# Patient Record
Sex: Female | Born: 2000 | Hispanic: No | Marital: Single | Smoking: Never smoker
Health system: Southern US, Community
[De-identification: ages and names within clinical notes are randomized; demographics above are authoritative.]

## PROBLEM LIST (undated history)

## (undated) DIAGNOSIS — E282 Polycystic ovarian syndrome: Secondary | ICD-10-CM

## (undated) HISTORY — DX: Polycystic ovarian syndrome: E28.2

---
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
## 2018-07-23 ENCOUNTER — Other Ambulatory Visit: Payer: Self-pay | Admitting: Critical Care Medicine

## 2018-07-23 DIAGNOSIS — Z20822 Contact with and (suspected) exposure to covid-19: Secondary | ICD-10-CM

## 2018-07-27 ENCOUNTER — Other Ambulatory Visit: Payer: Self-pay

## 2018-07-27 ENCOUNTER — Observation Stay (HOSPITAL_COMMUNITY)
Admission: AD | Admit: 2018-07-27 | Discharge: 2018-07-28 | Disposition: A | Discharge status: Home / Self Care | Payer: Medicaid Other | Source: Intra-hospital | Attending: Psychiatry | Admitting: Psychiatry

## 2018-07-27 ENCOUNTER — Emergency Department (HOSPITAL_COMMUNITY)
Admission: EM | Admit: 2018-07-27 | Discharge: 2018-07-27 | Disposition: A | Discharge status: Other Acute Inpatient Hospital | Payer: Medicaid Other | Attending: Emergency Medicine | Admitting: Emergency Medicine

## 2018-07-27 ENCOUNTER — Encounter (HOSPITAL_COMMUNITY): Payer: Self-pay | Admitting: *Deleted

## 2018-07-27 DIAGNOSIS — Z03818 Encounter for observation for suspected exposure to other biological agents ruled out: Secondary | ICD-10-CM | POA: Diagnosis not present

## 2018-07-27 DIAGNOSIS — Y999 Unspecified external cause status: Secondary | ICD-10-CM | POA: Insufficient documentation

## 2018-07-27 DIAGNOSIS — Z046 Encounter for general psychiatric examination, requested by authority: Secondary | ICD-10-CM | POA: Diagnosis not present

## 2018-07-27 DIAGNOSIS — E282 Polycystic ovarian syndrome: Secondary | ICD-10-CM | POA: Diagnosis not present

## 2018-07-27 DIAGNOSIS — Z23 Encounter for immunization: Secondary | ICD-10-CM | POA: Insufficient documentation

## 2018-07-27 DIAGNOSIS — S71112A Laceration without foreign body, left thigh, initial encounter: Secondary | ICD-10-CM | POA: Insufficient documentation

## 2018-07-27 DIAGNOSIS — Z915 Personal history of self-harm: Secondary | ICD-10-CM | POA: Diagnosis not present

## 2018-07-27 DIAGNOSIS — Y9389 Activity, other specified: Secondary | ICD-10-CM | POA: Diagnosis not present

## 2018-07-27 DIAGNOSIS — F329 Major depressive disorder, single episode, unspecified: Secondary | ICD-10-CM | POA: Insufficient documentation

## 2018-07-27 DIAGNOSIS — S79922A Unspecified injury of left thigh, initial encounter: Secondary | ICD-10-CM | POA: Diagnosis present

## 2018-07-27 DIAGNOSIS — R45851 Suicidal ideations: Secondary | ICD-10-CM | POA: Diagnosis not present

## 2018-07-27 DIAGNOSIS — Z79899 Other long term (current) drug therapy: Secondary | ICD-10-CM | POA: Insufficient documentation

## 2018-07-27 DIAGNOSIS — Y929 Unspecified place or not applicable: Secondary | ICD-10-CM | POA: Diagnosis not present

## 2018-07-27 DIAGNOSIS — S71111A Laceration without foreign body, right thigh, initial encounter: Secondary | ICD-10-CM | POA: Diagnosis not present

## 2018-07-27 DIAGNOSIS — F322 Major depressive disorder, single episode, severe without psychotic features: Secondary | ICD-10-CM | POA: Diagnosis present

## 2018-07-27 DIAGNOSIS — X788XXA Intentional self-harm by other sharp object, initial encounter: Secondary | ICD-10-CM | POA: Insufficient documentation

## 2018-07-27 LAB — CBC WITH DIFFERENTIAL/PLATELET
Abs Immature Granulocytes: 0.01 10*3/uL (ref 0.00–0.07)
Basophils Absolute: 0 10*3/uL (ref 0.0–0.1)
Basophils Relative: 0 %
Eosinophils Absolute: 0 10*3/uL (ref 0.0–0.5)
Eosinophils Relative: 1 %
HCT: 44.6 % (ref 36.0–46.0)
Hemoglobin: 15.3 g/dL — ABNORMAL HIGH (ref 12.0–15.0)
Immature Granulocytes: 0 %
Lymphocytes Relative: 27 %
Lymphs Abs: 0.9 10*3/uL (ref 0.7–4.0)
MCH: 32.6 pg (ref 26.0–34.0)
MCHC: 34.3 g/dL (ref 30.0–36.0)
MCV: 94.9 fL (ref 80.0–100.0)
Monocytes Absolute: 0.2 10*3/uL (ref 0.1–1.0)
Monocytes Relative: 7 %
Neutro Abs: 2.2 10*3/uL (ref 1.7–7.7)
Neutrophils Relative %: 65 %
Platelets: 214 10*3/uL (ref 150–400)
RBC: 4.7 MIL/uL (ref 3.87–5.11)
RDW: 11.7 % (ref 11.5–15.5)
WBC: 3.4 10*3/uL — ABNORMAL LOW (ref 4.0–10.5)
nRBC: 0 % (ref 0.0–0.2)

## 2018-07-27 LAB — BASIC METABOLIC PANEL
Anion gap: 9 (ref 5–15)
BUN: 6 mg/dL (ref 6–20)
CO2: 21 mmol/L — ABNORMAL LOW (ref 22–32)
Calcium: 9.4 mg/dL (ref 8.9–10.3)
Chloride: 107 mmol/L (ref 98–111)
Creatinine, Ser: 0.6 mg/dL (ref 0.44–1.00)
GFR calc Af Amer: >60 mL/min (ref >60)
GFR calc non Af Amer: >60 mL/min (ref >60)
Glucose, Bld: 116 mg/dL — ABNORMAL HIGH (ref 70–99)
Potassium: 3.4 mmol/L — ABNORMAL LOW (ref 3.5–5.1)
Sodium: 137 mmol/L (ref 135–145)

## 2018-07-27 LAB — RAPID URINE DRUG SCREEN, HOSP PERFORMED
Amphetamines: NOT DETECTED
Barbiturates: NOT DETECTED
Benzodiazepines: NOT DETECTED
Cocaine: NOT DETECTED
Opiates: NOT DETECTED
Tetrahydrocannabinol: NOT DETECTED

## 2018-07-27 LAB — SARS CORONAVIRUS 2 BY RT PCR (HOSPITAL ORDER, PERFORMED IN ~~LOC~~ HOSPITAL LAB): SARS Coronavirus 2: NEGATIVE

## 2018-07-27 LAB — ACETAMINOPHEN LEVEL: Acetaminophen (Tylenol), Serum: <10 ug/mL — ABNORMAL LOW (ref 10–30)

## 2018-07-27 LAB — ETHANOL: Alcohol, Ethyl (B): <10 mg/dL (ref <10)

## 2018-07-27 LAB — I-STAT BETA HCG BLOOD, ED (MC, WL, AP ONLY): I-stat hCG, quantitative: <5 m[IU]/mL (ref <5)

## 2018-07-27 LAB — SALICYLATE LEVEL: Salicylate Lvl: <7 mg/dL (ref 2.8–30.0)

## 2018-07-27 MED ORDER — ACETAMINOPHEN 325 MG PO TABS: 650.0000 mg | ORAL_TABLET | Freq: Four times a day (QID) | ORAL | Status: DC | PRN

## 2018-07-27 MED ORDER — MAGNESIUM HYDROXIDE 400 MG/5ML PO SUSP: 30.0000 mL | Freq: Every day | ORAL | Status: DC | PRN

## 2018-07-27 MED ORDER — LIDOCAINE-EPINEPHRINE (PF) 2 %-1:200000 IJ SOLN
10.0000 mL | Freq: Once | INTRAMUSCULAR | Status: AC
  Administered 2018-07-27: 10 mL
  Filled 2018-07-27: qty 20

## 2018-07-27 MED ORDER — TETANUS-DIPHTH-ACELL PERTUSSIS 5-2.5-18.5 LF-MCG/0.5 IM SUSP
0.5000 mL | Freq: Once | INTRAMUSCULAR | Status: AC
  Administered 2018-07-27: 0.5 mL via INTRAMUSCULAR
  Filled 2018-07-27: qty 0.5

## 2018-07-27 MED ORDER — ALUM & MAG HYDROXIDE-SIMETH 200-200-20 MG/5ML PO SUSP: 30.0000 mL | ORAL | Status: DC | PRN

## 2018-07-27 MED ORDER — HYDROXYZINE HCL 25 MG PO TABS: 25.0000 mg | ORAL_TABLET | Freq: Three times a day (TID) | ORAL | Status: DC | PRN

## 2018-07-27 MED ORDER — TRAZODONE HCL 50 MG PO TABS: 50.0000 mg | ORAL_TABLET | Freq: Every evening | ORAL | Status: DC | PRN

## 2018-07-27 NOTE — BH Assessment (Signed)
Tele Assessment Note   Patient Name: Mary Hobbs MRN: [id number] Referring Physician: Wynetta FinesMessick, Peter C, MD Location of Patient: [facility]  Mary Hobbs is an 18 y.o. female present to [facility] after cutting herself. Patient required three stitches. Patient report she started cutting herself last July 2019 after she got on low estrogen birth control. Since that time patient report she has depressive symptoms such as feeling overwhelmed, anxious, and mood swings around when her period is scheduled to start. Report the symptoms last around one week after her period ends. Report she cut herself to relieve stress. Patient denied suicidal / homicidal ideations, denied auditory / visual hallucinations. Denied history of psychiatric services. Denied psychiatric history in her family. Report she last cut herself four months ago and stressed she tires not to cut herself. Report prior to four months ago she was cutting herself every three weeks.   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 She lives at home with her parents and siblings. Report a happy childhood, denying physical, verbal, or sexual trauma. Denies problems with eating or sleeping. Patient affect was pleasant. She spoke clear and her thought process was within normal range.   Collateral: TTS attempted to contact patient's mother to obtain collateral information and determine if she would be able to keep the patient safe. TTS was unable to reach but left a HIPAA complaint voice-mail.   Disposition:  Reola Calkinsravis Money, NP, recommendation [facility]    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 PCOS (polycystic ovarian syndrome)     Past Surgical History:   Procedure Laterality Date  . WISDOM TOOTH EXTRACTION  2018    Family History: No family history on file.  Social History:  reports that she has never smoked. She has never used smokeless tobacco. She reports that she does not drink alcohol or use drugs.  Additional Social History:  Alcohol / Drug Use Pain Medications: see MAR Prescriptions: see MAR Over the Counter: see MAR History of alcohol / drug use?: No history of alcohol / drug abuse  CIWA: CIWA-Ar BP: 139/89 Pulse Rate: 97 COWS:    Allergies: No Known Allergies  Home Medications: (Not in a hospital admission)   OB/GYN Status:  Patient's last menstrual period was 07/19/2018.  General Assessment Data [facility] T[facility] Assessment: In system Is this a Tele or Face-to-Face Assessment?: Tele Assessment Is this an Initial Assessment or a Re-assessment for this encounter?: Initial Assessment Patient Accompanied by:: N/A Language Other than English: No Living Arrangements: Other (Comment)(live with parents ) What gender do you identify as?: Female Marital status: Single Maiden name: Nettleton  Pregnancy Status: No Living Arrangements: Parent(report lives with parents ) Can pt return to current living arrangement?: Yes Admission Status: Voluntary Is patient capable of signing voluntary admission?: Yes Referral Source: Self/Family/Friend Insurance type: Medicaid      Crisis Care Plan Living Arrangements: Parent(report lives with parents ) Legal Guardian: Other:(self) Name of Psychiatrist: denies  Name of Therapist: denies   Education Status Is patient currently in school?: Yes  Current Grade: Freshman, [employer]  Highest grade of school patient has completed: completed high school, [employer]  Name of school: [employer]  Contact person: n/a  Risk to self with the past 6 months Suicidal Ideation: No Has patient been a risk to self within the past 6 months prior to admission? : No Suicidal  Intent: No Has patient had any suicidal intent within the past 6 months prior to admission? : No Is patient at risk for suicide?: No Suicidal Plan?: No Has patient had any suicidal plan within the past 6 months prior to admission? : No Access to Means: No What has been your use of drugs/alcohol within the last 12 months?: none report  Previous Attempts/Gestures: No How many times?: 0 Other Self Harm Risks: patient cuts herself Triggers for Past Attempts: Other (Comment)(unspecified depression after montly cycle) Intentional Self Injurious Behavior: Cutting Comment - Self Injurious Behavior: pt report she cuts after her cycle  Family Suicide History: No Recent stressful life event(s): (denied ) Persecutory voices/beliefs?: No Depression: Yes Depression Symptoms: (feeling overwhelmed ) Substance abuse history and/or treatment for substance abuse?: No Suicide prevention information given to non-admitted patients: Not applicable  Risk to Others within the past 6 months Homicidal Ideation: No Does patient have any lifetime risk of violence toward others beyond the six months prior to admission? : No Thoughts of Harm to Others: No Current Homicidal Intent: No Current Homicidal Plan: No Access to Homicidal Means: No Identified Victim: na History of harm to others?: No Assessment of Violence: None Noted Violent Behavior Description: none noted  Does patient have access to weapons?: No Criminal Charges Pending?: No Does patient have a court date: No Is patient on probation?: No  Psychosis Hallucinations: None noted Delusions: None noted  Mental Status Report Appearance/Hygiene: In hospital gown Eye Contact: Good Motor Activity: Freedom of movement Speech: Logical/coherent Level of Consciousness: Alert Mood: Pleasant Affect: Appropriate to circumstance Anxiety Level: None Thought Processes: Coherent, Relevant Judgement: Unimpaired Orientation: Person, Place, Time, Situation,  Appropriate for developmental age Obsessive Compulsive Thoughts/Behaviors: None  Cognitive Functioning Concentration: Normal Memory: Recent Intact, Remote Intact Is patient IDD: No Insight: Fair Impulse Control: Fair Appetite: Good Have you had any weight changes? : No Change Sleep: No Change Total Hours of Sleep: 8(report no issues with sleep ) Vegetative Symptoms: None  ADLScreening [facility]) Patient's cognitive ability adequate to safely complete daily activities?: Yes Patient able to express need for assistance with ADLs?: Yes Independently performs ADLs?: Yes (appropriate for developmental age)  Prior Inpatient Therapy Prior Inpatient Therapy: No  Prior Outpatient Therapy Prior Outpatient Therapy: No Does patient have an ACCT team?: No Does patient have Intensive In-House Services?  : No Does patient have [facility] services? : No Does patient have P4CC services?: No  ADL Screening (condition at time of admission) Patient's cognitive ability adequate to safely complete daily activities?: Yes Is the patient deaf or have difficulty hearing?: No Does the patient have difficulty seeing, even when wearing glasses/contacts?: No Does the patient have difficulty concentrating, remembering, or making decisions?: No Patient able to express need for assistance with ADLs?: Yes Does the patient have difficulty dressing or bathing?: No Independently performs ADLs?: Yes (appropriate for developmental age) Does the patient have difficulty walking or climbing stairs?: No Weakness of Legs: None Weakness of Arms/Hands: None       Abuse/Neglect Assessment (Assessment to be complete while patient is alone) Abuse/Neglect Assessment Can Be Completed: Yes Physical Abuse: Denies Verbal Abuse: Denies Sexual  Abuse: Denies Exploitation of patient/patient's resources: Denies     Regulatory affairs officer (For Healthcare) Does Patient Have a Medical Advance Directive?: No Would  patient like information on creating a medical advance directive?: No - Patient declined          Disposition:  Disposition Initial Assessment Completed for this Encounter: Yes  This service was provided via telemedicine using a 2-way, interactive audio and video technology.  Names of all persons participating in this telemedicine service and their role in this encounter. Name: Mary Hobbs Role: patient   Name: Mary Hobbs.  Role: TTS   Mary Hobbs [facility] 07/27/2018 5:38 PM

## 2018-07-27 NOTE — Discharge Instructions (Signed)
Go directly to [facility].

## 2018-07-27 NOTE — ED Notes (Signed)
Pt changed into purple scrubs. Wanded and cleared by security.

## 2018-07-27 NOTE — ED Notes (Signed)
Observation and transfer consent forms signed by Pt. [facility] transport contacted and on the way.

## 2018-07-27 NOTE — ED Notes (Signed)
Two open lacerations noted to anterior side of left thigh. One 5 cm in length and another at 8 cm. One laceration noted to right thigh measuring at 5 cm. Bleeding controlled. Adipose tissue visible.

## 2018-07-27 NOTE — Plan of Care (Signed)
[facility] Observation Crisis Plan  Reason for Crisis Plan:  Crisis Stabilization and Medication Management   Plan of Care:  Referral for Inpatient Hospitalization and Referral for IOP  Family Support:      Current Living Environment:  Living Arrangements: Parent  Insurance:   Hospital Account    Name Acct ID Class Status Primary Coverage   Zionah, [place] [id number] [place]        Guarantor Account (for Hospital Account [id number])    Name Relation to Pt Service Area Active? Acct Type   Madill, Maureen Chatters Self [facility] Yes [facility]   Address Phone       [address] [place], [place] [address] [phone])          Coverage Information (for Hospital Account [id number])    F/O Payor/Plan Precert #   [place] Subscriber #   Cassidy, Tashiro [id number]   Address Phone   [address] [place], [place] [address] [phone]      Legal Guardian:     Primary Care Provider:  [facility]  Current Outpatient Providers:N/A  Psychiatrist:     Counselor/Therapist:     Compliant with Medications:N/A  Additional Information:   Clarisa Fling 7/12/202010:06 PM

## 2018-07-27 NOTE — ED Triage Notes (Signed)
Pt. Stated, This all started last August when I started college.

## 2018-07-27 NOTE — ED Triage Notes (Signed)
Pt. Stated, I just trying to hurt myself, I cut my left thigh area.  Pt. Crying at SORT.

## 2018-07-27 NOTE — ED Provider Notes (Signed)
[facility] EMERGENCY DEPARTMENT Provider Note   CSN: [id number] Arrival date & time: 07/27/18  1425     History   Chief Complaint Chief Complaint  Patient presents with  . Extremity Laceration  . Suicidal    HPI Mary Hobbs is a 18 y.o. female.     18 year old female with medical history as detailed below presents for evaluation of self-inflicted lacerations.  Patient reports that she was distraught and upset.  She cut herself in order to make herself feel better.  She denies current suicidality.  Patient admits to prior cutting behaviors.  She reports acute lacerations to the anterior aspects of both thighs.  She reports to use a small razor for this.  She is unsure of her last tetanus.  She denies overdose.  She denies prior psychiatric care or treatment.  The history is provided by the patient and medical records.  Laceration Location: anterior left and right thighs. Depth:  Cutaneous Quality: straight   Bleeding: controlled   Time since incident:  2 hours Laceration mechanism:  Razor Pain details:    Severity:  Mild   Progression:  Unchanged Foreign body present:  No foreign bodies Relieved by:  Nothing Worsened by:  Nothing Tetanus status:  Unknown   Past Medical History:  Diagnosis Date  . PCOS (polycystic ovarian syndrome)     There are no active problems to display for this patient.   Past Surgical History:  Procedure Laterality Date  . WISDOM TOOTH EXTRACTION  2018     OB History   No obstetric history on file.      Home Medications    Prior to Admission medications   Medication Sig Start Date End Date Taking? Authorizing Provider  LO LOESTRIN FE 1 MG-10 MCG / 10 MCG tablet Take 1 tablet by mouth daily. 07/23/17   Constant, Peggy, MD    Family History No family history on file.  Social History Social History   Tobacco Use  . Smoking status: Never Smoker  . Smokeless tobacco: Never Used  Substance Use  Topics  . Alcohol use: No  . Drug use: Never     Allergies   Patient has no known allergies.   Review of Systems Review of Systems   Physical Exam Updated Vital Signs BP 139/89 (BP Location: Right Arm)   Pulse 97   Temp 98.3 F (36.8 C) (Oral)   Resp 12   Ht 5' (1.524 m)   Wt 57.2 kg   LMP 07/19/2018   SpO2 99%   BMI 24.61 kg/m   Physical Exam Vitals signs and nursing note reviewed.  Constitutional:      General: She is not in acute distress.    Appearance: She is well-developed.  HENT:     Head: Normocephalic and atraumatic.  Eyes:     Conjunctiva/sclera: Conjunctivae normal.     Pupils: Pupils are equal, round, and reactive to light.  Neck:     Musculoskeletal: Normal range of motion and neck supple.  Cardiovascular:     Rate and Rhythm: Normal rate and regular rhythm.     Heart sounds: Normal heart sounds.  Pulmonary:     Effort: Pulmonary effort is normal. No respiratory distress.     Breath sounds: Normal breath sounds.  Abdominal:     General: There is no distension.     Palpations: Abdomen is soft.     Tenderness: There is no abdominal tenderness.  Musculoskeletal: Normal range of motion.  General: No deformity.  Skin:    General: Skin is warm and dry.     Comments: Lacerations noted to the anterior right and left thighs Laceration on the right anterior thigh is approximately 3 cm in length  2 lacerations are noted to the anterior left thigh -1 is 2 cm in length and the other is approximately 3 cm in length  No active bleeding noted.  Wounds are superficial with minimal involvement of underlying structure   Neurological:     Mental Status: She is alert and oriented to person, place, and time.      ED Treatments / Results  Labs (all labs ordered are listed, but only abnormal results are displayed) Labs Reviewed  BASIC METABOLIC PANEL - Abnormal; Notable for the following components:      Result Value   Potassium 3.4 (*)    CO2 21  (*)    Glucose, Bld 116 (*)    All other components within normal limits  ACETAMINOPHEN LEVEL - Abnormal; Notable for the following components:   Acetaminophen (Tylenol), Serum <10 (*)    All other components within normal limits  CBC WITH DIFFERENTIAL/PLATELET - Abnormal; Notable for the following components:   WBC 3.4 (*)    Hemoglobin 15.3 (*)    All other components within normal limits  ETHANOL  SALICYLATE LEVEL  RAPID URINE DRUG SCREEN, HOSP PERFORMED  I-STAT BETA HCG BLOOD, ED ([facility], [facility], [facility] ONLY)    EKG None  Radiology No results found.  Procedures .Marland Kitchen.Laceration Repair  Date/Time: 07/27/2018 4:34 PM Performed by: Wynetta FinesMessick, Valoria Tamburri C, MD Authorized by: Wynetta FinesMessick, Raynee Mccasland C, MD   Consent:    Consent obtained:  Verbal   Consent given by:  Patient   Risks discussed:  Infection, pain, poor cosmetic result, need for additional repair, nerve damage, poor wound healing, retained foreign body, tendon damage and vascular damage   Alternatives discussed:  No treatment Anesthesia (see MAR for exact dosages):    Anesthesia method:  Local infiltration   Local anesthetic:  Lidocaine 2% WITH epi Laceration details:    Location: Anterior right and left thighs.   Wound length (cm): 3 lacerations -3 cm laceration to the anterior right thigh 2 cm laceration to the anterior left thigh 3 cm laceration anterior left thigh,   Laceration depth: 5. Repair type:    Repair type:  Simple Pre-procedure details:    Preparation:  Patient was prepped and draped in usual sterile fashion Exploration:    Wound extent: no foreign bodies/material noted, no nerve damage noted, no tendon damage noted, no underlying fracture noted and no vascular damage noted   Treatment:    Area cleansed with:  Betadine and saline   Amount of cleaning:  Extensive   Irrigation solution:  Sterile saline Skin repair:    Repair method:  Sutures   Suture size:  4-0   Suture material:  Prolene   Suture technique:  Running  Approximation:    Approximation:  Close Post-procedure details:    Dressing:  Open (no dressing)   Patient tolerance of procedure:  Tolerated well, no immediate complications   (including critical care time)  Medications Ordered in ED Medications  Tdap (BOOSTRIX) injection 0.5 mL (has no administration in time range)  lidocaine-EPINEPHrine (XYLOCAINE W/EPI) 2 %-1:200000 (PF) injection 10 mL (has no administration in time range)     Initial Impression / Assessment and Plan / ED Course  I have reviewed the triage vital signs and the nursing notes.  Pertinent labs & imaging results that were available during my care of the patient were reviewed by me and considered in my medical decision making (see chart for details).       MDM  Screen complete  Thana AtesLesdi Y Blassingame was evaluated in Emergency Department on 07/27/2018 for the symptoms described in the history of present illness. She was evaluated in the context of the global COVID-19 pandemic, which necessitated consideration that the patient might be at risk for infection with the SARS-CoV-2 virus that causes COVID-19. Institutional protocols and algorithms that pertain to the evaluation of patients at risk for COVID-19 are in a state of rapid change based on information released by regulatory bodies including the CDC and federal and state organizations. These policies and algorithms were followed during the patient's care in the ED.  Patient is presenting for evaluation of self-inflicted lacerations to the anterior thighs.  She is expressing symptoms of depression with possible SI.  Lacerations repaired in the ED without difficulty.  Patient tolerated this well.  Screening labs obtained.  Patient is without evidence of significant other acute medical process.  She is medically clear at this time for further psychiatric evaluation and treatment.    Final disposition is dependent upon psychiatric assessment.  Final Clinical  Impressions(s) / ED Diagnoses   Final diagnoses:  Suicidal ideation  Laceration of left thigh, initial encounter  Laceration of right thigh, initial encounter    ED Discharge Orders    None       Wynetta FinesMessick, Crissy Mccreadie C, MD 07/27/18 1755

## 2018-07-27 NOTE — [facility] Counselor (Signed)
TTS writer attempted to contact patient's mother to obtain collateral information. There was no answer, HIPAA complaint voice-mail left.

## 2018-07-27 NOTE — ED Notes (Signed)
Pt transported to [facility] by [facility]. Discharge paperwork completed by MD. Transfer and observation consent forms signed.   Patient verbalizes understanding of discharge instructions.

## 2018-07-28 ENCOUNTER — Encounter (HOSPITAL_COMMUNITY): Payer: Self-pay | Admitting: Registered Nurse

## 2018-07-28 DIAGNOSIS — F329 Major depressive disorder, single episode, unspecified: Secondary | ICD-10-CM | POA: Diagnosis not present

## 2018-07-28 DIAGNOSIS — F322 Major depressive disorder, single episode, severe without psychotic features: Secondary | ICD-10-CM | POA: Diagnosis not present

## 2018-07-28 LAB — NOVEL CORONAVIRUS, NAA: SARS-CoV-2, NAA: NOT DETECTED

## 2018-07-28 NOTE — Progress Notes (Signed)
D: Patient States, "I feel much better, I hadn't slept in like 3 days because I was studying for chemistry and I think that was part of my problem, I was so overwhelmed."  Pt verbalizes readiness for discharge. Denies suicidal and homicidal ideations. Denies auditory and visual hallucinations.  No complaints of pain.  A:  Patient receptive to discharge instructions. Questions encouraged, both verbalize understanding.  R:  Escorted to the lobby by this RN.

## 2018-07-28 NOTE — Discharge Instructions (Signed)
For your behavioral health needs, you are advised to follow up with an outpatient therapist.  Call one of the following providers at your earliest opportunity to ask about scheduling an intake appointment:       [facility] [place]      [address]      [place], [place] [address]      [phone]       [facility] [place] [place], [place] [address]      [phone]       [facility]      [address]      [place],  [address]      [phone]

## 2018-07-28 NOTE — BH Assessment (Signed)
[facility] Assessment Progress Note  Per Hampton Abbot, MD, this pt does not require psychiatric hospitalization at this time.  Pt is to be discharged from the [facility] Observ[facility] Unit with referral information for area outpatient therapists.  This has been included in pt's discharge instructions.  Pt's nurse, Freda Munro, has been notified.  Jalene Mullet, [place] Triage Specialist [phone]

## 2018-07-28 NOTE — Discharge Summary (Addendum)
[id number]  Psych ED Discharge  07/28/2018 12:45 PM [name]  MRN:  [id number] Principal Problem: MDD (major depressive disorder), severe [facility](HCC) Discharge Diagnoses: Principal Problem:   MDD (major depressive disorder), severe (HCC)   Evaluation: Mary Hobbs was seen by attending psychiatrist and nurse practitioner. Mary Hobbs is awake, alert and oriented X 3.  Patient is currently denying suicidal or homicidal ideations during this assessment.  Denies auditory or  visual hallucinations.  Patient reports feeling stressed and overwhelmed related to his starting school and caring for her younger siblings.  She denies depression or depressive symptoms.  Reports a history of cutting to relieve anxiety and stress.  Denies previous inpatient admissions.  Denies previous suicide attempts.  Collateral obtained.  NP spoke to patient's mother who felt safe with patient coming home.  Discussed starting partial hospitalization programming on 07/29/2018 patient and family was receptive to this plan. Support, encouragement and reassurances was provided.  Per admission assessment-  Mary Hobbs is an 18 y.o. female present to [facility] after cutting herself. Patient required three stitches. Patient report she started cutting herself last July 2019 after she got on low estrogen birth control. Since that time patient report she has depressive symptoms such as feeling overwhelmed, anxious, and mood swings around when her period is scheduled to start. Report the symptoms last around one week after her period ends. Report she cut herself to relieve stress. Patient denied suicidal / homicidal ideations, denied auditory / visual hallucinations. Denied history of psychiatric services. Denied psychiatric history in her family. Report she last cut herself four months ago and stressed she tires not to cut herself. Report prior to four months ago she was cutting herself every three weeks.   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 She lives at home with her parents and siblings. Report a happy childhood, denying physical, verbal, or sexual trauma. Denies problems with eating or sleeping. Patient affect was pleasant. She spoke clear and her thought process was within normal range  Total Time spent with patient: 15 minutes  Past Psychiatric History:   Past Medical History:  Past Medical History:  Diagnosis Date  . PCOS (polycystic ovarian syndrome)     Past Surgical History:  Procedure Laterality Date  . WISDOM TOOTH EXTRACTION  2018   Family History: History reviewed. No pertinent family history. Family Psychiatric  History: Social History:  Social History   Substance and Sexual Activity  Alcohol Use No     Social History   Substance and Sexual Activity  Drug Use Never    Social History   Socioeconomic History  . Marital status: Single    Spouse name: Not on file  . Number of children: Not on file  . Years of education: Not on file  . Highest education level: Not on file  Occupational History  . Not on file  Social Needs  . Financial resource strain: Not on file  . Food insecurity    Worry: Not on file    Inability: Not on file  . Transportation needs    Medical: Not on file    Non-medical: Not on file  Tobacco Use  . Smoking status: Never Smoker  . Smokeless tobacco: Never Used  Substance and Sexual Activity  . Alcohol use: No  . Drug use: Never  . Sexual activity: Yes    Partners: Male  Lifestyle  . Physical activity    Days per week: Not on file    Minutes per  session: Not on file  . Stress: Not on file  Relationships  . Social Musicianconnections    Talks on phone: Not on file    Gets together: Not on file    Attends religious service: Not on file    Active member of club or organization: Not on file    Attends meetings of clubs or organizations: Not on file    Relationship status: Not on file  Other Topics Concern  . Not on file  Social History  Narrative  . Not on file    Has this patient used any form of tobacco in the last 30 days? (Cigarettes, Smokeless Tobacco, Cigars, and/or Pipes)   Current Medications: Current Facility-Administered Medications  Medication Dose Route Frequency Provider Last Rate Last Dose  . acetaminophen (TYLENOL) tablet 650 mg  650 mg Oral Q6H PRN Money, Gerlene Burdockravis B, FNP      . alum & mag hydroxide-simeth (MAALOX/MYLANTA) 200-200-20 MG/5ML suspension 30 mL  30 mL Oral Q4H PRN Money, Gerlene Burdockravis B, FNP      . hydrOXYzine (ATARAX/VISTARIL) tablet 25 mg  25 mg Oral TID PRN Money, Gerlene Burdockravis B, FNP      . magnesium hydroxide (MILK OF MAGNESIA) suspension 30 mL  30 mL Oral Daily PRN Money, Gerlene Burdockravis B, FNP      . traZODone (DESYREL) tablet 50 mg  50 mg Oral QHS PRN Money, Gerlene Burdockravis B, FNP       PTA Medications: Medications Prior to Admission  Medication Sig Dispense Refill Last Dose  . LO LOESTRIN FE 1 MG-10 MCG / 10 MCG tablet Take 1 tablet by mouth daily. 3 Package 3     Musculoskeletal: Strength & Muscle Tone: within normal limits Gait & Station: normal Patient leans: N/A  Psychiatric Specialty Exam: Physical Exam  Vitals reviewed. Constitutional: She appears well-developed.  Psychiatric: She has a normal mood and affect. Her behavior is normal.    Review of Systems  Psychiatric/Behavioral: Negative for depression and suicidal ideas. The patient is nervous/anxious.   All other systems reviewed and are negative.   Blood pressure 130/73, pulse 93, temperature 99 F (37.2 C), resp. rate 16, last menstrual period 07/19/2018, SpO2 100 %.There is no height or weight on file to calculate BMI.  General Appearance: Casual  Eye Contact:  Good  Speech:  Clear and Coherent  Volume:  Normal  Mood:  Anxious and Depressed  Affect:  Congruent  Thought Process:  Coherent  Orientation:  Full (Time, Place, and Person)  Thought Content:  Logical  Suicidal Thoughts:  No  Homicidal Thoughts:  No  Memory:  Immediate;    Fair Recent;   Fair  Judgement:  Fair  Insight:  Fair  Psychomotor Activity:  Normal  Concentration:  Concentration: Fair  Recall:  FiservFair  Fund of Knowledge:  Fair  Language:  Fair  Akathisia:  No  Handed:  Right  AIMS (if indicated):     Assets:  Communication Skills Desire for Improvement Physical Health Resilience Social Support  ADL's:  Intact  Cognition:  WNL  Sleep:        Demographic Factors:  NA  Loss Factors: NA  Historical Factors: NA  Risk Reduction Factors:   Responsible for children under 20 years of age, Sense of responsibility to family, Living with another person, especially a relative, Positive social support and Positive therapeutic relationship  Continued Clinical Symptoms:  Depression/ Anxiety related to stress  Cognitive Features That Contribute To Risk:  Closed-mindedness    Suicide Risk:  Minimal: No identifiable suicidal ideation.  Patients presenting with no risk factors but with morbid ruminations; may be classified as minimal risk based on the severity of the depressive symptoms    Plan Of Care/Follow-up recommendations:  Activity:  as tolerated Diet:  heart healthy   Disposition:    Discharge from [facility] Unit Assessment for Partial Hospitalization on 07/29/2018  Take all medications as prescribed. Keep all follow-up appointments as scheduled.  Do not consume alcohol or use illegal drugs while on prescription medications. Report any adverse effects from your medications to your primary care provider promptly.  In the event of recurrent symptoms or worsening symptoms, call 911, a crisis hotline, or go to the nearest emergency department for evaluation.    Derrill Center, NP 07/28/2018, 12:45 PM   Patient seen, evaluated by me and treatment plan done by me. Patient safe for discharge with follow up in [facility]

## 2018-07-28 NOTE — Progress Notes (Signed)
[facility] NOVEL CORONAVIRUS (COVID-19) DAILY CHECK-OFF SYMPTOMS - answer yes or no to each - every day NO YES  Have you had a fever in the past 24 hours?  . Fever (Temp > 37.80C / 100F) X   Have you had any of these symptoms in the past 24 hours? . New Cough .  Sore Throat  .  Shortness of Breath .  Difficulty Breathing .  Unexplained Body Aches   X   Have you had any one of these symptoms in the past 24 hours not related to allergies?   . Runny Nose .  Nasal Congestion .  Sneezing   X   If you have had runny nose, nasal congestion, sneezing in the past 24 hours, has it worsened?  X   EXPOSURES - check yes or no X   Have you traveled outside the state in the past 14 days?  X   Have you been in contact with someone with a confirmed diagnosis of COVID-19 or PUI in the past 14 days without wearing appropriate PPE?  X   Have you been living in the same home as a person with confirmed diagnosis of COVID-19 or a PUI (household contact)?    X   Have you been diagnosed with COVID-19?    X              What to do next: Answered NO to all: Answered YES to anything:   Proceed with unit schedule Follow the [facility] Inpatient Flowsheet.   

## 2018-07-29 ENCOUNTER — Telehealth (HOSPITAL_COMMUNITY): Payer: Self-pay | Admitting: Professional

## 2018-08-05 ENCOUNTER — Emergency Department (HOSPITAL_COMMUNITY)
Admission: EM | Admit: 2018-08-05 | Discharge: 2018-08-05 | Disposition: A | Discharge status: Home / Self Care | Payer: Medicaid Other | Attending: Emergency Medicine | Admitting: Emergency Medicine

## 2018-08-05 ENCOUNTER — Encounter (HOSPITAL_COMMUNITY): Payer: Self-pay | Admitting: *Deleted

## 2018-08-05 ENCOUNTER — Other Ambulatory Visit: Payer: Self-pay

## 2018-08-05 DIAGNOSIS — Z4802 Encounter for removal of sutures: Secondary | ICD-10-CM | POA: Diagnosis present

## 2018-08-05 NOTE — ED Triage Notes (Signed)
Pt comes today for removal of sutures. Incisions healing ,no drainage observed. There is bruseing at incision site. RT and Lt upper anterior thigh.

## 2018-08-05 NOTE — ED Provider Notes (Signed)
[facility] EMERGENCY DEPARTMENT Provider Note   CSN: [id number] Arrival date & time: 08/05/18  1131    History   Chief Complaint Chief Complaint  Patient presents with  . Suture / Staple Removal    HPI Mary Hobbs is a 18 y.o. female.     HPI   18 year old female presents today for suture removal.  She suffered self-inflicted lacerations to her bilateral thighs on 07/27/2018.  She had stitches placed at that time.  She notes no complications.  She notes that she is feeling much better mentally with no complaints.  Past Medical History:  Diagnosis Date  . PCOS (polycystic ovarian syndrome)     Patient Active Problem List   Diagnosis Date Noted  . MDD (major depressive disorder), single episode, severe , no psychosis ([place]) 07/28/2018  . MDD (major depressive disorder), severe ([place]) 07/27/2018    Past Surgical History:  Procedure Laterality Date  . WISDOM TOOTH EXTRACTION  2018     OB History   No obstetric history on file.      Home Medications    Prior to Admission medications   Medication Sig Start Date End Date Taking? Authorizing Provider  LO LOESTRIN FE 1 MG-10 MCG / 10 MCG tablet Take 1 tablet by mouth daily. 07/23/17   Constant, Vickii Chafe, MD    Family History History reviewed. No pertinent family history.  Social History Social History   Tobacco Use  . Smoking status: Never Smoker  . Smokeless tobacco: Never Used  Substance Use Topics  . Alcohol use: No  . Drug use: Never     Allergies   Patient has no known allergies.   Review of Systems Review of Systems  All other systems reviewed and are negative.    Physical Exam Updated Vital Signs BP 116/68 (BP Location: Right Arm)   Pulse 81   Temp 98.3 F (36.8 C) (Oral)   Resp 16   LMP 07/19/2018   SpO2 98%   Physical Exam Vitals signs and nursing note reviewed.  Constitutional:      Appearance: She is well-developed.  HENT:     Head: Normocephalic and  atraumatic.  Eyes:     General: No scleral icterus.       Right eye: No discharge.        Left eye: No discharge.     Conjunctiva/sclera: Conjunctivae normal.     Pupils: Pupils are equal, round, and reactive to light.  Neck:     Musculoskeletal: Normal range of motion.     Vascular: No JVD.     Trachea: No tracheal deviation.  Pulmonary:     Effort: Pulmonary effort is normal.     Breath sounds: No stridor.  Skin:    Comments: 1 laceration on the right upper thigh approximately 4 cm in length, 2 lacerations on the left upper thigh approximately 4, and 5 cm in length-stitches in place with no surrounding redness discharge or swelling  Neurological:     Mental Status: She is alert and oriented to person, place, and time.     Coordination: Coordination normal.  Psychiatric:        Behavior: Behavior normal.        Thought Content: Thought content normal.        Judgment: Judgment normal.      ED Treatments / Results  Labs (all labs ordered are listed, but only abnormal results are displayed) Labs Reviewed - No data to display  EKG  None  Radiology No results found.  Procedures .Suture Removal  Date/Time: 08/05/2018 12:09 PM Performed by: Eyvonne MechanicHedges, Wendal Wilkie, PA-C Authorized by: Eyvonne MechanicHedges, Ashritha Desrosiers, PA-C   Consent:    Consent obtained:  Verbal   Consent given by:  Patient   Risks discussed:  Wound separation, pain and bleeding   Alternatives discussed:  No treatment, alternative treatment and delayed treatment Location:    Location:  Lower extremity   Lower extremity location: bilateral thighs  Procedure details:    Wound appearance:  No signs of infection, good wound healing and clean   Number of sutures removed:  3 Post-procedure details:    Post-removal:  Antibiotic ointment applied   Patient tolerance of procedure:  Tolerated well, no immediate complications   (including critical care time)  Medications Ordered in ED Medications - No data to display   Initial  Impression / Assessment and Plan / ED Course  I have reviewed the triage vital signs and the nursing notes.  Pertinent labs & imaging results that were available during my care of the patient were reviewed by me and considered in my medical decision making (see chart for details).        Patient here for suture removal.  No complicating features.  No signs of emotional distress.  Discharged with return precautions.  Patient verbalized understanding and agreement to today's plan had no further questions or concerns at time of discharge. Final Clinical Impressions(s) / ED Diagnoses   Final diagnoses:  Visit for suture removal    ED Discharge Orders    None       Rosalio LoudHedges, Damani Kelemen, PA-C 08/05/18 1211    Loren RacerYelverton, David, MD 08/08/18 1659

## 2018-08-05 NOTE — Discharge Instructions (Addendum)
Please read attached information. If you experience any new or worsening signs or symptoms please return to the emergency room for evaluation. Please follow-up with your primary care provider or specialist as discussed.  °

## 2018-08-05 NOTE — ED Notes (Signed)
Patient verbalizes understanding of discharge instructions . Opportunity for questions and answers were provided . Armband removed by staff ,Pt discharged from ED. W/C  offered at D/C  and Declined W/C at D/C and was escorted to lobby by RN.  

## 2018-10-05 IMAGING — US US PELVIS COMPLETE TRANSABD/TRANSVAG
1 series · 14 of 25 positions shown · non-contrast
Comparison: None

CLINICAL DATA: Initial evaluation for amenorrhea.  History of PCOS.



[Series 1: us pelvis complete transabd/transvag · 0.17mm/px · 14 of 71 slices shown]
[im 1/71]
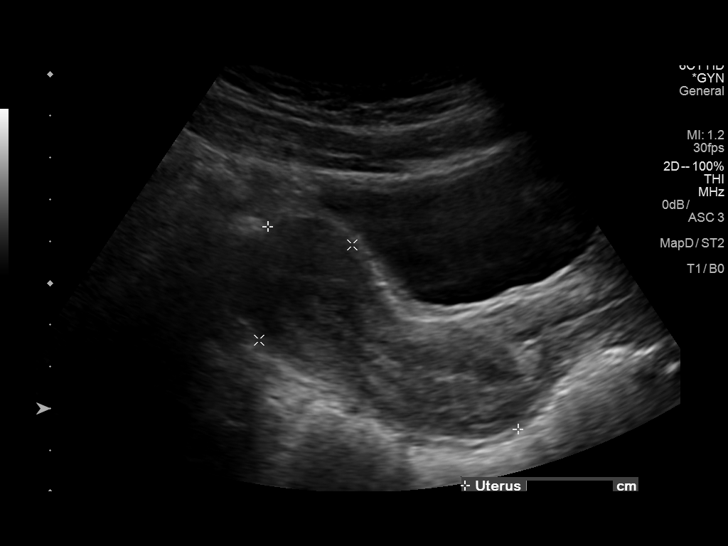
[im 6/71]
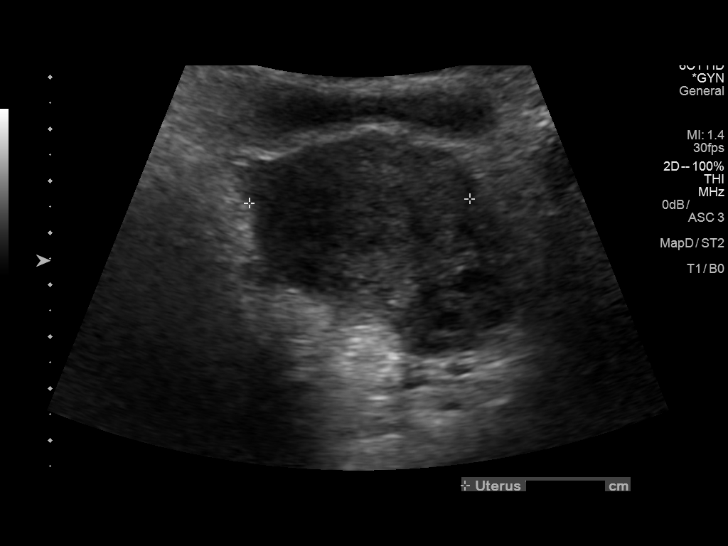
[im 12/71]
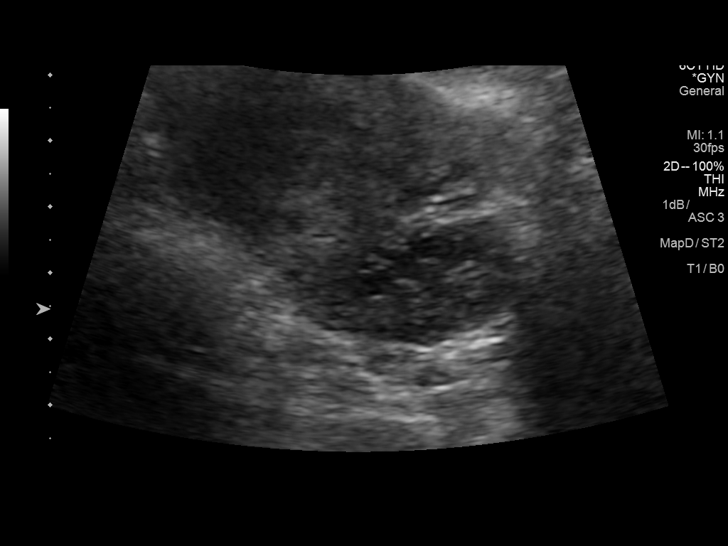
[im 18/71]
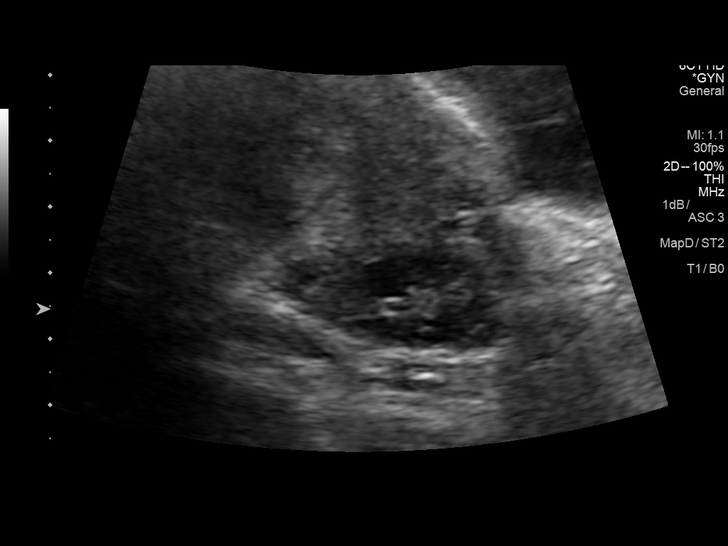
[im 24/71]
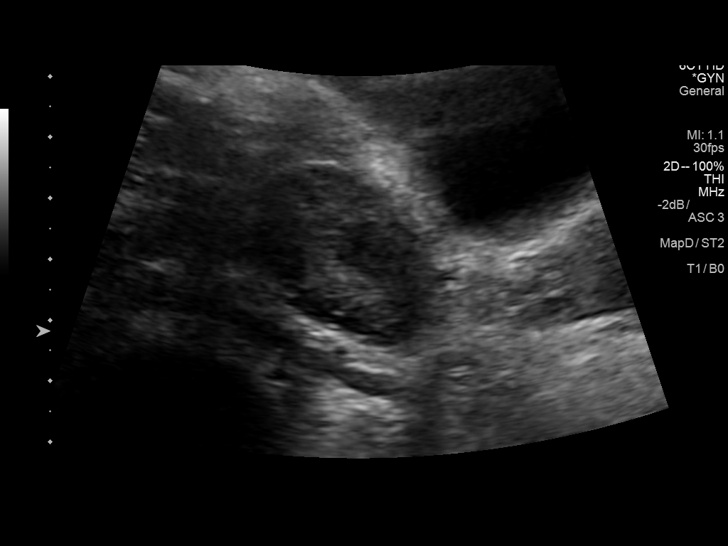
[im 27/71]
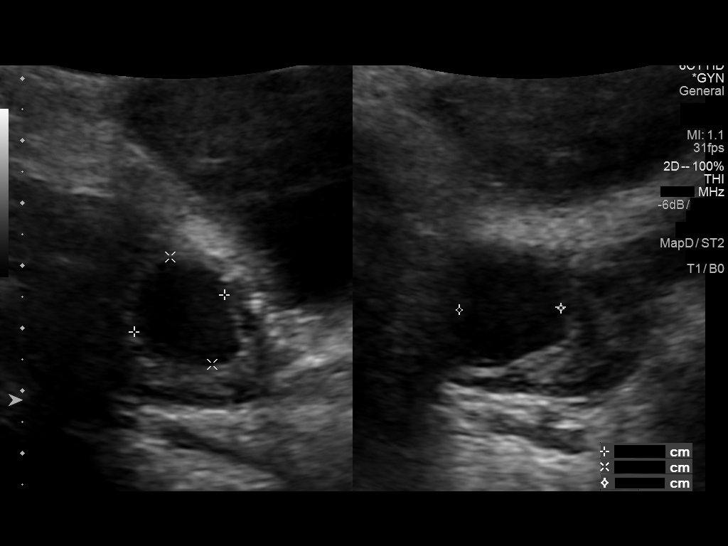
[im 33/71]
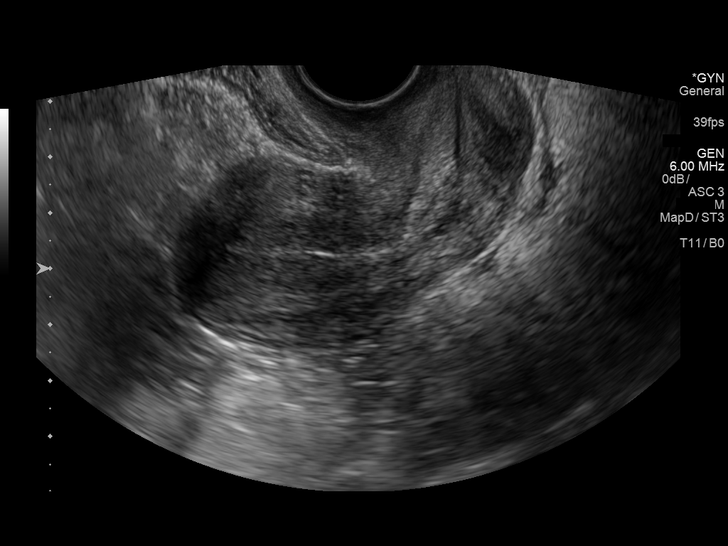
[im 38/71]
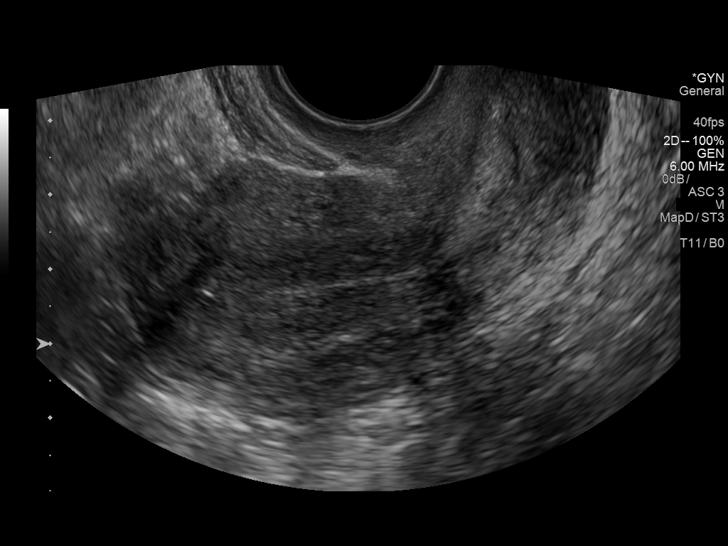
[im 44/71]
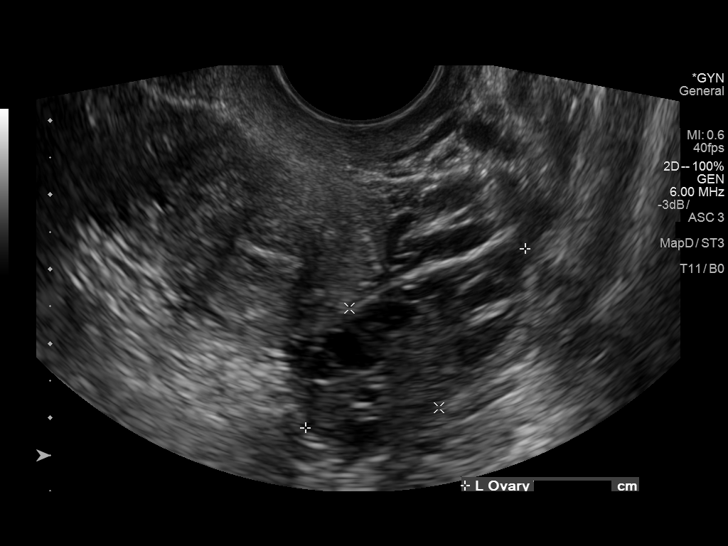
[im 47/71]
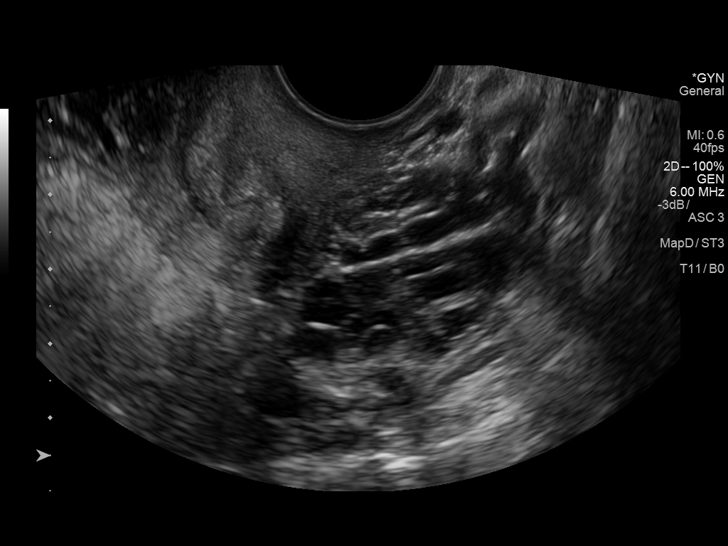
[im 53/71]
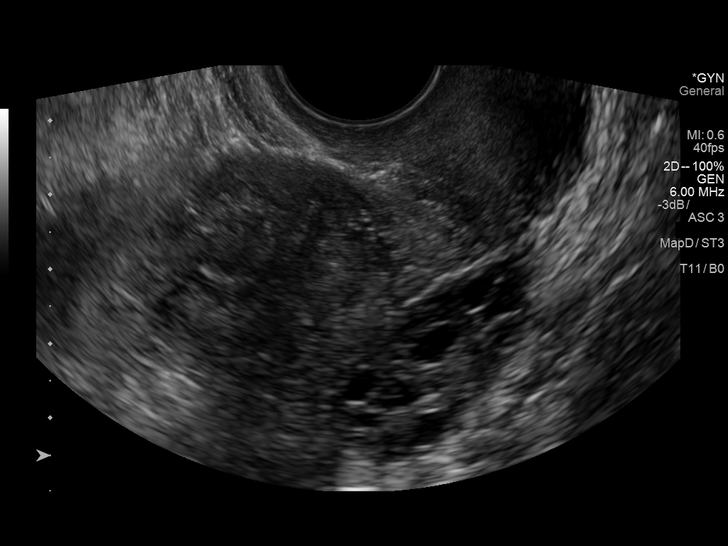
[im 59/71]
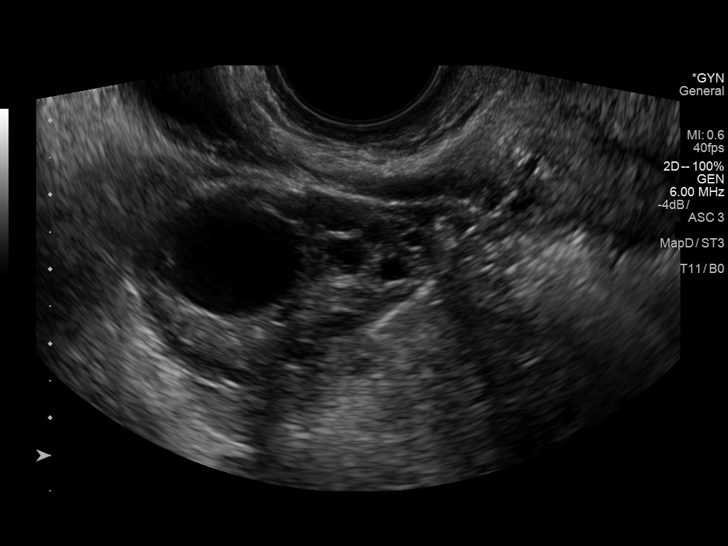
[im 65/71]
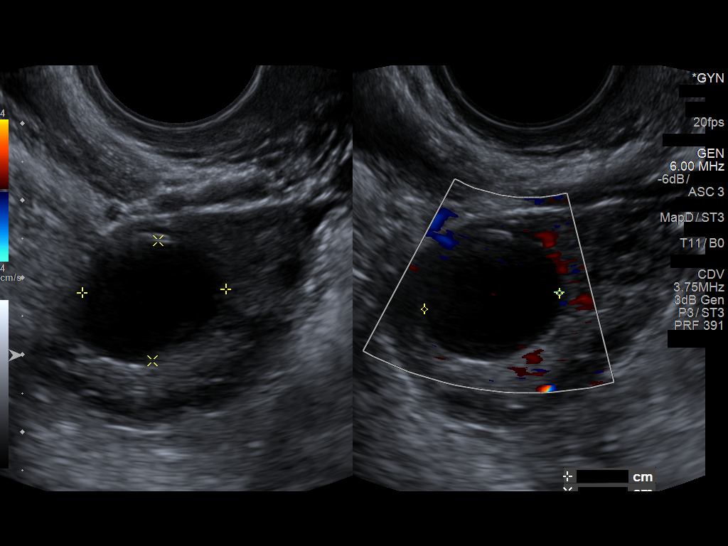
[im 71/71]
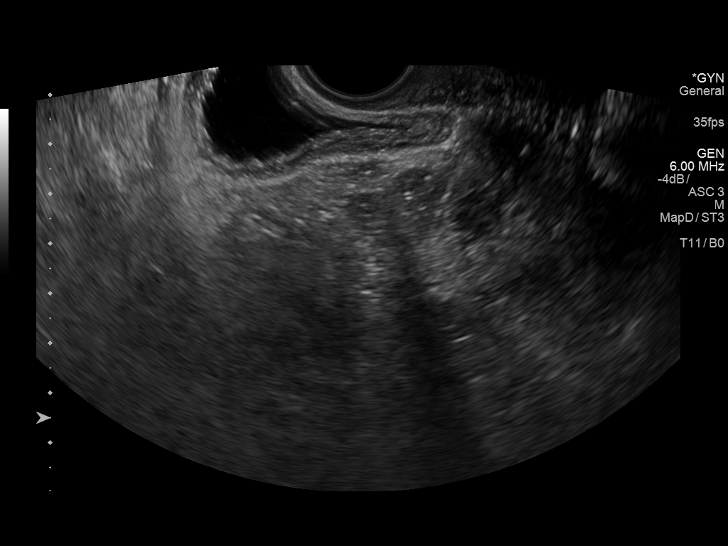

[14 of 25 positions shown; findings below may reference images not displayed]

FINDINGS: Uterus

Measurements: 7.7 x 3.2 x 4.2 cm. No fibroids or other mass
visualized.

Endometrium

Thickness: 6 mm. Mild diffuse endometrial thickening at the lower
uterine segment without focal abnormality.

Right ovary

Measurements: 3.7 x 2.7 x 4.1 cm. 1.9 x 1.6 x 1.8 cm dominant
follicle noted.

Left ovary

Measurements: 3.4 x 1.8 x 3.8 cm. Normal appearance/no adnexal mass.

Other findings

No abnormal free fluid.
IMPRESSION: Normal pelvic ultrasound.

## 2019-01-20 ENCOUNTER — Encounter (HOSPITAL_COMMUNITY): Payer: Self-pay

## 2019-01-20 ENCOUNTER — Ambulatory Visit (HOSPITAL_COMMUNITY)
Admission: EM | Admit: 2019-01-20 | Discharge: 2019-01-20 | Disposition: A | Discharge status: Home / Self Care | Payer: Medicaid Other | Attending: Family Medicine | Admitting: Family Medicine

## 2019-01-20 ENCOUNTER — Other Ambulatory Visit: Payer: Self-pay

## 2019-01-20 DIAGNOSIS — R300 Dysuria: Secondary | ICD-10-CM | POA: Insufficient documentation

## 2019-01-20 LAB — POCT URINALYSIS DIP (DEVICE)
Bilirubin Urine: NEGATIVE
Glucose, UA: NEGATIVE mg/dL
Hgb urine dipstick: NEGATIVE
Ketones, ur: 40 mg/dL — AB
Leukocytes,Ua: NEGATIVE
Nitrite: NEGATIVE
Protein, ur: NEGATIVE mg/dL
Specific Gravity, Urine: 1.025 (ref 1.005–1.030)
Urobilinogen, UA: 0.2 mg/dL (ref 0.0–1.0)
pH: 7.5 (ref 5.0–8.0)

## 2019-01-20 MED ORDER — CEPHALEXIN 500 MG PO CAPS: 500.0000 mg | ORAL_CAPSULE | Freq: Two times a day (BID) | ORAL | 0 refills | Status: DC

## 2019-01-20 MED ORDER — PHENAZOPYRIDINE HCL 200 MG PO TABS: 200.0000 mg | ORAL_TABLET | Freq: Three times a day (TID) | ORAL | 0 refills | Status: DC

## 2019-01-20 NOTE — ED Triage Notes (Signed)
Pt. States she cant urinate, states it burns, abdominal pain, when urinating she gets chills. This started 3 weeks ago.

## 2019-01-20 NOTE — ED Provider Notes (Signed)
[facility]    CSN: [id number] Arrival date & time: 01/20/19  1044      History   Chief Complaint Chief Complaint  Patient presents with  . Recurrent UTI    HPI Mary Hobbs is a 19 y.o. female.   HPI  Patient is here for possible urinary tract infection.  She states she said recurring urinary infections.  She states that she has burning, abdominal crampy pain, and chills whenever she urinates.  No fever.  No flank pain.  No nausea or vomiting.  No history of kidney stones or kidney problems.  Her periods been regular.  She is certain that she is not pregnant.  No vaginal infection.  No concern for vaginal/STD  Past Medical History:  Diagnosis Date  . PCOS (polycystic ovarian syndrome)     Patient Active Problem List   Diagnosis Date Noted  . MDD (major depressive disorder), single episode, severe , no psychosis (HCC) 07/28/2018  . MDD (major depressive disorder), severe (HCC) 07/27/2018    Past Surgical History:  Procedure Laterality Date  . WISDOM TOOTH EXTRACTION  2018    OB History   No obstetric history on file.      Home Medications    Prior to Admission medications   Medication Sig Start Date End Date Taking? Authorizing Provider  cephALEXin (KEFLEX) 500 MG capsule Take 1 capsule (500 mg total) by mouth 2 (two) times daily. 01/20/19   Eustace Moore, MD  LO LOESTRIN FE 1 MG-10 MCG / 10 MCG tablet Take 1 tablet by mouth daily. 07/23/17   Constant, Peggy, MD  phenazopyridine (PYRIDIUM) 200 MG tablet Take 1 tablet (200 mg total) by mouth 3 (three) times daily. 01/20/19   Eustace Moore, MD    Family History Family History  Problem Relation Age of Onset  . Healthy Mother   . Healthy Father     Social History Social History   Tobacco Use  . Smoking status: Never Smoker  . Smokeless tobacco: Never Used  Substance Use Topics  . Alcohol use: No  . Drug use: Never     Allergies   Patient has no known allergies.   Review  of Systems Review of Systems  Constitutional: Negative for chills and fever.  HENT: Negative for congestion and hearing loss.   Eyes: Negative for pain.  Respiratory: Negative for cough and shortness of breath.   Cardiovascular: Negative for chest pain and leg swelling.  Gastrointestinal: Negative for abdominal pain, constipation and diarrhea.  Genitourinary: Positive for difficulty urinating, dysuria and frequency. Negative for flank pain, hematuria, menstrual problem and pelvic pain.  Musculoskeletal: Negative for myalgias.  Neurological: Negative for dizziness, seizures and headaches.  Psychiatric/Behavioral: The patient is not nervous/anxious.      Physical Exam Triage Vital Signs ED Triage Vitals [01/20/19 1149]  Enc Vitals Group     BP 106/67     Pulse Rate 95     Resp 18     Temp 98.7 F (37.1 C)     Temp Source Oral     SpO2 100 %     Weight 136 lb (61.7 kg)     Height      Head Circumference      Peak Flow      Pain Score 8     Pain Loc      Pain Edu?      Excl. in GC?    No data found.  Updated Vital Signs BP  106/67 (BP Location: Right Arm)   Pulse 95   Temp 98.7 F (37.1 C) (Oral)   Resp 18   Wt 61.7 kg   LMP 01/09/2019   SpO2 100%   BMI 26.56 kg/m      Physical Exam Constitutional:      General: She is not in acute distress.    Appearance: She is well-developed and normal weight.  HENT:     Head: Normocephalic and atraumatic.     Mouth/Throat:     Comments: Mask in place Eyes:     Conjunctiva/sclera: Conjunctivae normal.     Pupils: Pupils are equal, round, and reactive to light.  Cardiovascular:     Rate and Rhythm: Normal rate and regular rhythm.     Heart sounds: Normal heart sounds.  Pulmonary:     Effort: Pulmonary effort is normal. No respiratory distress.     Breath sounds: Normal breath sounds.  Abdominal:     General: Abdomen is flat. There is no distension.     Palpations: Abdomen is soft.     Tenderness: There is no  abdominal tenderness.  Musculoskeletal:        General: Normal range of motion.     Cervical back: Normal range of motion.  Skin:    General: Skin is warm and dry.  Neurological:     Mental Status: She is alert.  Psychiatric:        Mood and Affect: Mood normal.        Behavior: Behavior normal.      UC Treatments / Results  Labs (all labs ordered are listed, but only abnormal results are displayed) Labs Reviewed  POCT URINALYSIS DIP (DEVICE) - Abnormal; Notable for the following components:      Result Value   Ketones, ur 40 (*)    All other components within normal limits  URINE CULTURE    EKG   Radiology No results found.  Procedures Procedures (including critical care time)  Medications Ordered in UC Medications - No data to display  Initial Impression / Assessment and Plan / UC Course  I have reviewed the triage vital signs and the nursing notes.  Pertinent labs & imaging results that were available during my care of the patient were reviewed by me and considered in my medical decision making (see chart for details).     Urine is relatively benign.  Patient does not abdominal pain.  She declines a pelvic examination.  I will reculture her urine and try an antibiotic.  I told her that she needs to follow-up with her PCP or GYN if she continues to have symptoms. Final Clinical Impressions(s) / UC Diagnoses   Final diagnoses:  Dysuria     Discharge Instructions     We have sent urine for culture Take the antibiotic for 5 days Take the pyridium as needed for pain Caution orange urine Follow up with your PCP    ED Prescriptions    Medication Sig Dispense Auth. Provider   phenazopyridine (PYRIDIUM) 200 MG tablet Take 1 tablet (200 mg total) by mouth 3 (three) times daily. 6 tablet Raylene Everts, MD   cephALEXin (KEFLEX) 500 MG capsule Take 1 capsule (500 mg total) by mouth 2 (two) times daily. 10 capsule Raylene Everts, MD     PDMP not  reviewed this encounter.   Raylene Everts, MD 01/20/19 2119

## 2019-01-20 NOTE — Discharge Instructions (Addendum)
We have sent urine for culture Take the antibiotic for 5 days Take the pyridium as needed for pain Caution orange urine Follow up with your PCP

## 2019-01-22 LAB — URINE CULTURE
Culture: >=100000 — AB
Special Requests: NORMAL

## 2019-12-24 ENCOUNTER — Ambulatory Visit (HOSPITAL_COMMUNITY): Payer: Self-pay

## 2019-12-25 ENCOUNTER — Ambulatory Visit (HOSPITAL_COMMUNITY): Payer: Self-pay

## 2020-08-19 ENCOUNTER — Other Ambulatory Visit: Payer: Self-pay

## 2020-08-19 ENCOUNTER — Encounter (HOSPITAL_COMMUNITY): Payer: Self-pay | Admitting: *Deleted

## 2020-08-19 ENCOUNTER — Ambulatory Visit (HOSPITAL_COMMUNITY)
Admission: EM | Admit: 2020-08-19 | Discharge: 2020-08-19 | Disposition: A | Discharge status: Home / Self Care | Payer: Medicaid Other | Attending: Emergency Medicine | Admitting: Emergency Medicine

## 2020-08-19 DIAGNOSIS — M542 Cervicalgia: Secondary | ICD-10-CM

## 2020-08-19 DIAGNOSIS — R519 Headache, unspecified: Secondary | ICD-10-CM

## 2020-08-19 DIAGNOSIS — M545 Low back pain, unspecified: Secondary | ICD-10-CM

## 2020-08-19 MED ORDER — IBUPROFEN 800 MG PO TABS: 800.0000 mg | ORAL_TABLET | Freq: Three times a day (TID) | ORAL | 0 refills | Status: DC

## 2020-08-19 MED ORDER — PREDNISONE 20 MG PO TABS: 40.0000 mg | ORAL_TABLET | Freq: Every day | ORAL | 0 refills | Status: DC

## 2020-08-19 MED ORDER — KETOROLAC TROMETHAMINE 30 MG/ML IJ SOLN
30.0000 mg | Freq: Once | INTRAMUSCULAR | Status: AC
  Administered 2020-08-19: 30 mg via INTRAMUSCULAR

## 2020-08-19 MED ORDER — KETOROLAC TROMETHAMINE 30 MG/ML IJ SOLN
INTRAMUSCULAR | Status: AC
  Filled 2020-08-19: qty 1
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
## 2020-08-19 NOTE — Discharge Instructions (Addendum)
Take prednisone every morning with food for 5 days beginning tomorrow  Once complete can use ibuprofen every 8 hours as needed  Can follow up with orthopedics if pain is persistent
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
## 2021-07-10 ENCOUNTER — Encounter (HOSPITAL_COMMUNITY): Payer: Self-pay | Admitting: Emergency Medicine

## 2021-07-10 ENCOUNTER — Ambulatory Visit (HOSPITAL_COMMUNITY)
Admission: EM | Admit: 2021-07-10 | Discharge: 2021-07-10 | Disposition: A | Discharge status: Home / Self Care | Payer: Medicaid Other | Attending: Student | Admitting: Student

## 2021-07-10 DIAGNOSIS — R197 Diarrhea, unspecified: Secondary | ICD-10-CM

## 2021-07-10 MED ORDER — LOPERAMIDE HCL 2 MG PO CAPS: 2.0000 mg | ORAL_CAPSULE | Freq: Four times a day (QID) | ORAL | 0 refills | Status: DC | PRN

## 2021-07-10 MED ORDER — ONDANSETRON 4 MG PO TBDP: 4.0000 mg | ORAL_TABLET | Freq: Three times a day (TID) | ORAL | 0 refills | Status: DC | PRN

## 2021-07-10 NOTE — ED Triage Notes (Signed)
Pt woke up with headache and diarrhea this morning. Reports went to work but had 2 more episodes of diarrhea and abd pains so left work. Reports went home to take a nap and feeling really fatigued. Denies n/v.

## 2021-07-10 NOTE — ED Provider Notes (Signed)
[facility]    CSN: [id number] Arrival date & time: 07/10/21  1605      History   Chief Complaint Chief Complaint  Patient presents with   Headache   Diarrhea    HPI Mary Hobbs is a 21 y.o. female presenting for work note following several episodes of diarrhea at work. History PCOS.  Patient describes fatigue, subjective chills, several episodes of watery diarrhea.  She states that she felt so ill that she had to leave work, and so is here for a work note.  Denies any nausea or vomiting.  Denies recent eating at restaurants, travel, antibiotics.  Denies urinary symptoms.  Denies sore throat, cough, congestion.  Has not attempted medications for the symptoms.  Tolerating fluids by mouth. No history abd procedures in the past.   HPI  Past Medical History:  Diagnosis Date   PCOS (polycystic ovarian syndrome)     Patient Active Problem List   Diagnosis Date Noted   MDD (major depressive disorder), single episode, severe , no psychosis (HCC) 07/28/2018   MDD (major depressive disorder), severe (HCC) 07/27/2018    Past Surgical History:  Procedure Laterality Date   WISDOM TOOTH EXTRACTION  2018    OB History   No obstetric history on file.      Home Medications    Prior to Admission medications   Medication Sig Start Date End Date Taking? Authorizing Provider  loperamide (IMODIUM) 2 MG capsule Take 1 capsule (2 mg total) by mouth 4 (four) times daily as needed for diarrhea or loose stools. 07/10/21  Yes Rhys Martini, PA-C  ondansetron (ZOFRAN-ODT) 4 MG disintegrating tablet Take 1 tablet (4 mg total) by mouth every 8 (eight) hours as needed for nausea or vomiting. 07/10/21  Yes Rhys Martini, PA-C  ibuprofen (ADVIL) 800 MG tablet Take 1 tablet (800 mg total) by mouth 3 (three) times daily. 08/19/20   Valinda Hoar, NP    Family History Family History  Problem Relation Age of Onset   Healthy Mother    Healthy Father     Social  History Social History   Tobacco Use   Smoking status: Never   Smokeless tobacco: Never  Vaping Use   Vaping Use: Never used  Substance Use Topics   Alcohol use: No   Drug use: Never     Allergies   Patient has no known allergies.   Review of Systems Review of Systems  Constitutional:  Negative for appetite change, chills and fever.  HENT:  Negative for congestion, ear pain, rhinorrhea, sinus pressure, sinus pain and sore throat.   Eyes:  Negative for redness and visual disturbance.  Respiratory:  Negative for cough, chest tightness, shortness of breath and wheezing.   Cardiovascular:  Negative for chest pain and palpitations.  Gastrointestinal:  Positive for diarrhea. Negative for abdominal pain, constipation, nausea and vomiting.  Genitourinary:  Negative for dysuria, frequency and urgency.  Musculoskeletal:  Negative for myalgias.  Neurological:  Negative for dizziness, weakness and headaches.  Psychiatric/Behavioral:  Negative for confusion.   All other systems reviewed and are negative.    Physical Exam Triage Vital Signs ED Triage Vitals  Enc Vitals Group     BP 07/10/21 1632 125/66     Pulse Rate 07/10/21 1632 95     Resp 07/10/21 1632 16     Temp 07/10/21 1632 99.9 F (37.7 C)     Temp Source 07/10/21 1632 Oral     SpO2 07/10/21  1632 100 %     Weight --      Height --      Head Circumference --      Peak Flow --      Pain Score 07/10/21 1631 9     Pain Loc --      Pain Edu? --      Excl. in GC? --    No data found.  Updated Vital Signs BP 125/66 (BP Location: Left Arm)   Pulse 95   Temp 99.9 F (37.7 C) (Oral)   Resp 16   LMP 07/09/2021   SpO2 100%   Visual Acuity Right Eye Distance:   Left Eye Distance:   Bilateral Distance:    Right Eye Near:   Left Eye Near:    Bilateral Near:     Physical Exam Vitals reviewed.  Constitutional:      General: She is not in acute distress.    Appearance: Normal appearance. She is not  ill-appearing.  HENT:     Head: Normocephalic and atraumatic.     Mouth/Throat:     Mouth: Mucous membranes are moist.     Comments: Moist mucous membranes Eyes:     Extraocular Movements: Extraocular movements intact.     Pupils: Pupils are equal, round, and reactive to light.  Cardiovascular:     Rate and Rhythm: Normal rate and regular rhythm.     Heart sounds: Normal heart sounds.  Pulmonary:     Effort: Pulmonary effort is normal.     Breath sounds: Normal breath sounds. No wheezing, rhonchi or rales.  Abdominal:     General: Bowel sounds are normal. There is no distension.     Palpations: Abdomen is soft. There is no mass.     Tenderness: There is abdominal tenderness in the epigastric area. There is no right CVA tenderness, left CVA tenderness, guarding or rebound.     Comments: Minimal epigastric pain to palpation without guarding or rebound   Skin:    General: Skin is warm.     Capillary Refill: Capillary refill takes less than 2 seconds.     Comments: Good skin turgor  Neurological:     General: No focal deficit present.     Mental Status: She is alert and oriented to person, place, and time.  Psychiatric:        Mood and Affect: Mood normal.        Behavior: Behavior normal.      UC Treatments / Results  Labs (all labs ordered are listed, but only abnormal results are displayed) Labs Reviewed - No data to display  EKG   Radiology No results found.  Procedures Procedures (including critical care time)  Medications Ordered in UC Medications - No data to display  Initial Impression / Assessment and Plan / UC Course  I have reviewed the triage vital signs and the nursing notes.  Pertinent labs & imaging results that were available during my care of the patient were reviewed by me and considered in my medical decision making (see chart for details).     This patient is a very pleasant 21 y.o. year old female presenting with diarrhea, requires work note.  Afebrile, nontachy. There is minimal abd pain to palpation, appears well hydrated, and she is tolerating fluids PO. LMP 07/09/21, States she is not pregnant or breastfeeding. Will cover with imodium and zofran to have on hand. Good hydration, BRAT diet. ED return precautions discussed. Patient verbalizes understanding and  agreement.   Final Clinical Impressions(s) / UC Diagnoses   Final diagnoses:  Diarrhea, unspecified type     Discharge Instructions      -Imodium (loperamide) over-the-counter. Take the Imodium (loperamide) up to 4 times daily for diarrhea. -Take the Zofran (ondansetron) up to 3 times daily for nausea and vomiting. Dissolve one pill under your tongue or between your teeth and your cheek. -Drink plenty of fluids and eat a bland diet as tolerated    ED Prescriptions     Medication Sig Dispense Auth. Provider   loperamide (IMODIUM) 2 MG capsule Take 1 capsule (2 mg total) by mouth 4 (four) times daily as needed for diarrhea or loose stools. 12 capsule Ignacia Bayley E, PA-C   ondansetron (ZOFRAN-ODT) 4 MG disintegrating tablet Take 1 tablet (4 mg total) by mouth every 8 (eight) hours as needed for nausea or vomiting. 21 tablet Rhys Martini, PA-C      PDMP not reviewed this encounter.   Rhys Martini, PA-C 07/10/21 1655

## 2021-08-01 ENCOUNTER — Encounter (HOSPITAL_COMMUNITY): Payer: Self-pay | Admitting: Emergency Medicine

## 2023-02-27 ENCOUNTER — Ambulatory Visit (HOSPITAL_COMMUNITY)
Admission: EM | Admit: 2023-02-27 | Discharge: 2023-02-27 | Disposition: A | Discharge status: Home / Self Care | Payer: Medicaid Other

## 2023-02-27 ENCOUNTER — Encounter (HOSPITAL_COMMUNITY): Payer: Self-pay

## 2023-02-27 DIAGNOSIS — B349 Viral infection, unspecified: Secondary | ICD-10-CM

## 2023-02-27 LAB — POC COVID19/FLU A&B COMBO
Covid Antigen, POC: NEGATIVE
Influenza A Antigen, POC: NEGATIVE
Influenza B Antigen, POC: NEGATIVE

## 2023-02-27 NOTE — ED Provider Notes (Signed)
[facility]    CSN: [id number] Arrival date & time: 02/27/23  1809      History   Chief Complaint Chief Complaint  Patient presents with   Headache    HPI CAELA HUOT is a 23 y.o. female.   Patient here today for evaluation of headache and fatigue that started 3 days ago. She reports that she has had some body aches with pain around the top of her shoulders. She denies congestion or cough. She has not had any nausea or vomiting or diarrhea. She has taken tylenol without resolution of symptoms.   The history is provided by the patient.    Past Medical History:  Diagnosis Date   PCOS (polycystic ovarian syndrome)     Patient Active Problem List   Diagnosis Date Noted   MDD (major depressive disorder), single episode, severe , no psychosis (HCC) 07/28/2018   MDD (major depressive disorder), severe (HCC) 07/27/2018    Past Surgical History:  Procedure Laterality Date   WISDOM TOOTH EXTRACTION  2018    OB History   No obstetric history on file.      Home Medications    Prior to Admission medications   Medication Sig Start Date End Date Taking? Authorizing Provider  levonorgestrel-ethinyl estradiol (ALESSE) 0.1-20 MG-MCG tablet Take 1 tablet by mouth daily. 02/19/23 05/14/23 Yes [provider]  sertraline (ZOLOFT) 25 MG tablet Take 1 tablet by mouth daily. 02/08/23  Yes [provider]    Family History Family History  Problem Relation Age of Onset   Healthy Mother    Healthy Father     Social History Social History   Tobacco Use   Smoking status: Never   Smokeless tobacco: Never  Vaping Use   Vaping status: Never Used  Substance Use Topics   Alcohol use: No   Drug use: Never     Allergies   Patient has no known allergies.   Review of Systems Review of Systems  Constitutional:  Negative for chills and fever.  HENT:  Positive for ear pain. Negative for congestion and sore throat.   Eyes:  Negative for  discharge and redness.  Respiratory:  Negative for cough, shortness of breath and wheezing.   Gastrointestinal:  Negative for abdominal pain, diarrhea, nausea and vomiting.  Musculoskeletal:  Positive for myalgias.  Neurological:  Positive for headaches.     Physical Exam Triage Vital Signs ED Triage Vitals  Encounter Vitals Group     BP      Systolic BP Percentile      Diastolic BP Percentile      Pulse      Resp      Temp      Temp src      SpO2      Weight      Height      Head Circumference      Peak Flow      Pain Score      Pain Loc      Pain Education      Exclude from Growth Chart    No data found.  Updated Vital Signs BP 126/77 (BP Location: Left Arm)   Pulse 91   Temp 98.6 F (37 C) (Oral)   Resp 16   Ht 4\' 11"  (1.499 m)   Wt 160 lb (72.6 kg)   LMP 02/18/2023 (Exact Date)   SpO2 97%   BMI 32.32 kg/m   Visual Acuity Right Eye Distance:  Left Eye Distance:   Bilateral Distance:    Right Eye Near:   Left Eye Near:    Bilateral Near:     Physical Exam Vitals and nursing note reviewed.  Constitutional:      General: She is not in acute distress.    Appearance: Normal appearance. She is not ill-appearing.  HENT:     Head: Normocephalic and atraumatic.     Right Ear: Tympanic membrane normal.     Left Ear: Tympanic membrane normal.     Nose: No congestion or rhinorrhea.     Mouth/Throat:     Mouth: Mucous membranes are moist.     Pharynx: No oropharyngeal exudate or posterior oropharyngeal erythema.  Eyes:     Conjunctiva/sclera: Conjunctivae normal.  Cardiovascular:     Rate and Rhythm: Normal rate and regular rhythm.     Heart sounds: Normal heart sounds. No murmur heard. Pulmonary:     Effort: Pulmonary effort is normal. No respiratory distress.     Breath sounds: Normal breath sounds. No wheezing, rhonchi or rales.  Skin:    General: Skin is warm and dry.  Neurological:     Mental Status: She is alert.  Psychiatric:        Mood  and Affect: Mood normal.        Thought Content: Thought content normal.      UC Treatments / Results  Labs (all labs ordered are listed, but only abnormal results are displayed) Labs Reviewed  POC COVID19/FLU A&B COMBO    EKG   Radiology No results found.  Procedures Procedures (including critical care time)  Medications Ordered in UC Medications - No data to display  Initial Impression / Assessment and Plan / UC Course  I have reviewed the triage vital signs and the nursing notes.  Pertinent labs & imaging results that were available during my care of the patient were reviewed by me and considered in my medical decision making (see chart for details).    Suspect viral etiology given body aches, advised symptomatic treatment with increased fluids and rest. Flu and covid screening negative. Advised follow up if no gradual improvement or with any worsening symptoms.   Final Clinical Impressions(s) / UC Diagnoses   Final diagnoses:  Viral syndrome   Discharge Instructions   None    ED Prescriptions   None    PDMP not reviewed this encounter.   Tomi Bamberger, PA-C 02/27/23 1955

## 2023-02-27 NOTE — ED Triage Notes (Signed)
Patient here today with c/o headache and fatigue since Monday. Patient states that it is worsening. Patient also has some soreness on the top of her shoulders. She tried taking Tylenol with no relief.
